# Patient Record
Sex: Female | Born: 1985 | Race: White | Hispanic: No | Marital: Married | Smoking: Never smoker
Health system: Southern US, Community
[De-identification: ages and names within clinical notes are randomized; demographics above are authoritative.]

## PROBLEM LIST (undated history)

## (undated) ENCOUNTER — Inpatient Hospital Stay (HOSPITAL_COMMUNITY): Payer: Self-pay

## (undated) DIAGNOSIS — I10 Essential (primary) hypertension: Secondary | ICD-10-CM

## (undated) DIAGNOSIS — K219 Gastro-esophageal reflux disease without esophagitis: Secondary | ICD-10-CM

## (undated) HISTORY — PX: BUNIONECTOMY: SHX129

---

## 2014-03-28 ENCOUNTER — Other Ambulatory Visit (HOSPITAL_COMMUNITY): Payer: Self-pay | Admitting: *Deleted

## 2014-03-28 ENCOUNTER — Encounter (HOSPITAL_COMMUNITY): Payer: Self-pay | Admitting: *Deleted

## 2014-03-28 DIAGNOSIS — Z3682 Encounter for antenatal screening for nuchal translucency: Secondary | ICD-10-CM

## 2014-03-29 ENCOUNTER — Encounter (HOSPITAL_COMMUNITY): Payer: Self-pay | Admitting: *Deleted

## 2014-04-06 ENCOUNTER — Other Ambulatory Visit (HOSPITAL_COMMUNITY): Payer: Self-pay

## 2014-04-12 ENCOUNTER — Ambulatory Visit (HOSPITAL_COMMUNITY)
Admission: RE | Admit: 2014-04-12 | Discharge: 2014-04-12 | Disposition: A | Discharge status: Home / Self Care | Payer: BLUE CROSS/BLUE SHIELD | Source: Ambulatory Visit | Attending: *Deleted | Admitting: *Deleted

## 2014-04-12 ENCOUNTER — Encounter (HOSPITAL_COMMUNITY): Payer: Self-pay

## 2014-04-12 ENCOUNTER — Other Ambulatory Visit (HOSPITAL_COMMUNITY): Payer: Self-pay | Admitting: *Deleted

## 2014-04-12 ENCOUNTER — Other Ambulatory Visit (HOSPITAL_COMMUNITY): Payer: Self-pay | Admitting: Obstetrics and Gynecology

## 2014-04-12 ENCOUNTER — Other Ambulatory Visit (HOSPITAL_COMMUNITY): Payer: Self-pay

## 2014-04-12 DIAGNOSIS — IMO0002 Reserved for concepts with insufficient information to code with codable children: Secondary | ICD-10-CM

## 2014-04-12 DIAGNOSIS — Z3A13 13 weeks gestation of pregnancy: Secondary | ICD-10-CM | POA: Insufficient documentation

## 2014-04-12 DIAGNOSIS — Z0489 Encounter for examination and observation for other specified reasons: Secondary | ICD-10-CM

## 2014-04-12 DIAGNOSIS — Z3682 Encounter for antenatal screening for nuchal translucency: Secondary | ICD-10-CM | POA: Insufficient documentation

## 2014-04-12 DIAGNOSIS — Z36 Encounter for antenatal screening of mother: Secondary | ICD-10-CM | POA: Insufficient documentation

## 2014-04-12 DIAGNOSIS — O10011 Pre-existing essential hypertension complicating pregnancy, first trimester: Secondary | ICD-10-CM | POA: Diagnosis not present

## 2014-04-12 DIAGNOSIS — O10919 Unspecified pre-existing hypertension complicating pregnancy, unspecified trimester: Secondary | ICD-10-CM | POA: Insufficient documentation

## 2014-04-12 HISTORY — DX: Essential (primary) hypertension: I10

## 2014-04-18 ENCOUNTER — Other Ambulatory Visit (HOSPITAL_COMMUNITY): Payer: Self-pay

## 2014-05-24 ENCOUNTER — Encounter (HOSPITAL_COMMUNITY): Payer: Self-pay

## 2014-05-24 ENCOUNTER — Ambulatory Visit (HOSPITAL_COMMUNITY)
Admission: RE | Admit: 2014-05-24 | Discharge: 2014-05-24 | Disposition: A | Discharge status: Home / Self Care | Payer: BLUE CROSS/BLUE SHIELD | Source: Ambulatory Visit | Attending: *Deleted | Admitting: *Deleted

## 2014-05-24 VITALS — BP 141/87 | HR 75 | Wt 280.8 lb

## 2014-05-24 DIAGNOSIS — O99212 Obesity complicating pregnancy, second trimester: Secondary | ICD-10-CM | POA: Insufficient documentation

## 2014-05-24 DIAGNOSIS — O99210 Obesity complicating pregnancy, unspecified trimester: Secondary | ICD-10-CM | POA: Insufficient documentation

## 2014-05-24 DIAGNOSIS — Z0489 Encounter for examination and observation for other specified reasons: Secondary | ICD-10-CM

## 2014-05-24 DIAGNOSIS — IMO0002 Reserved for concepts with insufficient information to code with codable children: Secondary | ICD-10-CM

## 2014-05-24 DIAGNOSIS — Z36 Encounter for antenatal screening of mother: Secondary | ICD-10-CM | POA: Diagnosis not present

## 2014-05-24 DIAGNOSIS — O10012 Pre-existing essential hypertension complicating pregnancy, second trimester: Secondary | ICD-10-CM | POA: Diagnosis present

## 2014-05-24 DIAGNOSIS — Z3689 Encounter for other specified antenatal screening: Secondary | ICD-10-CM | POA: Insufficient documentation

## 2014-05-24 DIAGNOSIS — Z3A19 19 weeks gestation of pregnancy: Secondary | ICD-10-CM | POA: Diagnosis not present

## 2014-05-24 DIAGNOSIS — O10912 Unspecified pre-existing hypertension complicating pregnancy, second trimester: Secondary | ICD-10-CM

## 2014-05-24 HISTORY — DX: Gastro-esophageal reflux disease without esophagitis: K21.9

## 2014-07-05 ENCOUNTER — Inpatient Hospital Stay (HOSPITAL_COMMUNITY)
Admission: AD | Admit: 2014-07-05 | Discharge: 2014-07-05 | Disposition: A | Discharge status: Home / Self Care | Payer: BLUE CROSS/BLUE SHIELD | Source: Ambulatory Visit | Attending: Obstetrics & Gynecology | Admitting: Obstetrics & Gynecology

## 2014-07-05 ENCOUNTER — Ambulatory Visit (HOSPITAL_COMMUNITY)
Admission: RE | Admit: 2014-07-05 | Discharge: 2014-07-05 | Disposition: A | Discharge status: Home / Self Care | Payer: BLUE CROSS/BLUE SHIELD | Source: Ambulatory Visit | Attending: *Deleted | Admitting: *Deleted

## 2014-07-05 ENCOUNTER — Encounter (HOSPITAL_COMMUNITY): Payer: Self-pay

## 2014-07-05 DIAGNOSIS — O4102X0 Oligohydramnios, second trimester, not applicable or unspecified: Secondary | ICD-10-CM | POA: Diagnosis not present

## 2014-07-05 DIAGNOSIS — O9989 Other specified diseases and conditions complicating pregnancy, childbirth and the puerperium: Secondary | ICD-10-CM | POA: Diagnosis present

## 2014-07-05 DIAGNOSIS — K219 Gastro-esophageal reflux disease without esophagitis: Secondary | ICD-10-CM | POA: Diagnosis not present

## 2014-07-05 DIAGNOSIS — O99612 Diseases of the digestive system complicating pregnancy, second trimester: Secondary | ICD-10-CM | POA: Diagnosis not present

## 2014-07-05 DIAGNOSIS — Z3A25 25 weeks gestation of pregnancy: Secondary | ICD-10-CM | POA: Diagnosis not present

## 2014-07-05 DIAGNOSIS — O4103X0 Oligohydramnios, third trimester, not applicable or unspecified: Secondary | ICD-10-CM | POA: Diagnosis not present

## 2014-07-05 DIAGNOSIS — O10012 Pre-existing essential hypertension complicating pregnancy, second trimester: Secondary | ICD-10-CM | POA: Diagnosis not present

## 2014-07-05 DIAGNOSIS — O99212 Obesity complicating pregnancy, second trimester: Secondary | ICD-10-CM

## 2014-07-05 DIAGNOSIS — O10912 Unspecified pre-existing hypertension complicating pregnancy, second trimester: Secondary | ICD-10-CM

## 2014-07-05 LAB — WET PREP, GENITAL
Clue Cells Wet Prep HPF POC: NONE SEEN
Trich, Wet Prep: NONE SEEN
Yeast Wet Prep HPF POC: NONE SEEN

## 2014-07-05 LAB — AMNISURE RUPTURE OF MEMBRANE (ROM) NOT AT ARMC: Amnisure ROM: NEGATIVE

## 2014-07-05 NOTE — Discharge Instructions (Signed)
Oligohydramnios °An unborn baby (fetus) lives in the mother's uterus in a sac of amniotic fluid. This fluid: °· Protects the fetus from trauma. °· Helps the fetus move freely inside the uterus. °· Helps the fetal lungs, kidneys, and digestive system develop. °· Protects the baby from infections.   °Oligohydramnios is when there is not enough amniotic fluid in the amniotic sac. If this happens early in pregnancy, a fetus might not develop normally. If this happens in the second half of a pregnancy, a fetus might not grow as much as it should and could cause problems during delivery.   °Oligohydramnios can also cause: °· Pregnancy loss (miscarriage). °· Premature birth. °· Deformities of the face or body. °· Problems with muscles and bones. °· Pressure and compression on the umbilical cord, which decreases oxygen to the fetus. °· Lung problems. °· Stillbirth. °CAUSES °A cause cannot always be found. However, possible causes include: °· A leak or a tear in the amniotic sac. °· A problem with the placenta. °· Having identical twins who share the same placenta. °· A fetal birth defect. This is usually something in the fetal kidneys or urinary tract that has not developed as it should. °· A pregnancy that goes past the due date. °· Something that affects the mother's body, such as: °¨ Dehydration. °¨ High blood pressure. °¨ Diabetes. °¨ Some medications (examples include ibuprofen and blood pressure medicines).  °¨ A disease that affects the skin, joints, kidneys, and other organs (systemic lupus). °¨ Birth defects. °SYMPTOMS °· There may be no symptoms.  °· Leaking fluid from the vagina. °· Measuring smaller than usual uterus at a routine pregnancy exam. °DIAGNOSIS °To diagnose and evaluate oligohydramnios, your caregiver may: °· Order a prenatal ultrasound test. This test: °¨ Measures the amniotic fluid level. This will show if the amount of fluid is right for the stage of pregnancy. °¨ Checks the fetal  kidneys. °¨ Checks fetal growth. °¨ Evaluates the placenta. °· Confirm that you broke your water, if this is suspected by your caregiver. °· Order a nonstress test. This noninvasive test is an assessment of fetal well-being. It monitors the fetal heart rate patterns over a 20-minute period. °· Order a biophysical profile. This test measures and evaluates 5 observations of the baby (results of nonstress testing, fetal breathing, movements, muscle tone, and amount of amniotic fluid). °· Assess fetal kick counts. Tell your caregiver if the baby appears to become less active. °· Order a uterine artery Doppler study. This is a type of ultrasound. It can show if enough blood and nourishment are getting to the fetus through the placenta and umbilical cord. °· Check your blood pressure. °· Check your blood sugar. °TREATMENT °Treatment will depend on how low the fluid is, how far along in the pregnancy you are, and your overall health. Treatment options include: °· Watching and waiting. You will need to be checked more often than normal. °· Increasing your fluid intake. This may be done by mouth, or you might get the fluids through the vein (intravenously, IV). °· Delivering the baby if recommended by your caregiver. °HOME CARE INSTRUCTIONS °· Only take medicine as directed by your caregiver, especially if you have a medical problem (diabetes, high blood pressure). °· Follow your caregiver's instructions regarding physical activity, especially if you have a medical problem (diabetes, high blood pressure). °· Keep all prenatal care appointments. °· Rest as much as possible. Your caregiver may put you on bed rest. °· Drink enough fluids to keep your urine clear or pale yellow. °·   Eat a healthy and nourishing diet. °· Do not smoke, drink alcohol, or take illegal drugs.  °SEEK MEDICAL CARE IF: °· You have any questions or worries about your pregnancy. °· You notice a decrease in fetal movement. °SEEK IMMEDIATE MEDICAL CARE IF:   °· Fluid comes out of your vagina. °· You start to have labor pains (contractions). °· You have a fever. °Document Released: 06/18/2010 Document Revised: 07/18/2013 Document Reviewed: 06/18/2010 °[employer]® Patient Information ©2015 [employer]. This information is not intended to replace advice given to you by your health care provider. Make sure you discuss any questions you have with your health care provider. ° °

## 2014-07-05 NOTE — MAU Note (Addendum)
Sent from MFM for further  Eval..  Hx of CHTN, not currently on meds.  Has been good, stressed-anxious/scared now.  AFI was lower today, no report of leaking.  Sent to r/u ROM

## 2014-07-05 NOTE — MAU Provider Note (Signed)
  History     CSN: [id number]  Arrival date and time: 07/05/14 1107   None     No chief complaint on file.  HPI  29 y.o. G1P0000 @[redacted]w[redacted]d  presents to MAU from MFM office for sterile spec exam and and amnisure. Denies contractions.  Past Medical History  Diagnosis Date  . Hypertension   . GERD (gastroesophageal reflux disease)     Past Surgical History  Procedure Laterality Date  . Bunionectomy      No family history on file.  History  Substance Use Topics  . Smoking status: Never Smoker   . Smokeless tobacco: Not on file  . Alcohol Use: No    Allergies: No Known Allergies  Prescriptions prior to admission  Medication Sig Dispense Refill Last Dose  . Cranberry 400 MG TABS Take 1 tablet by mouth daily.   Past Week at Unknown time  . docusate sodium (COLACE) 100 MG capsule Take 100 mg by mouth daily.   07/04/2014 at Unknown time  . Prenatal Vit-Fe Fumarate-FA (PRENATAL MULTIVITAMIN) TABS tablet Take 1 tablet by mouth daily at 12 noon.   07/05/2014 at Unknown time  . ranitidine (ZANTAC) 150 MG tablet Take 150 mg by mouth every evening.   07/04/2014 at Unknown time    Review of Systems  Genitourinary:       White vaginal discharge  All other systems reviewed and are negative.  Physical Exam   Blood pressure 144/98, pulse 76, temperature 97.8 F (36.6 C), temperature source Oral, resp. rate 18, last menstrual period 09/29/2013.  Physical Exam  Nursing note and vitals reviewed. Constitutional: She is oriented to person, place, and time. She appears well-developed and well-nourished. No distress.  HENT:  Head: Normocephalic and atraumatic.  Neck: Normal range of motion.  Cardiovascular: Normal rate.   Respiratory: Effort normal.  GI: Soft. She exhibits no distension and no mass. There is no tenderness. There is no rebound and no guarding.  Genitourinary: Vaginal discharge found.  No pooling noted on spec exam  Musculoskeletal: Normal range of motion.   Neurological: She is alert and oriented to person, place, and time.  Skin: Skin is warm and dry.  Psychiatric: She has a normal mood and affect. Her behavior is normal. Judgment and thought content normal.   Results for orders placed or performed during the hospital encounter of 07/05/14 (from the past 24 hour(s))  Amnisure rupture of membrane (rom)     Status: None   Collection Time: 07/05/14 11:50 AM  Result Value Ref Range   Amnisure ROM NEGATIVE   Wet prep, genital     Status: Abnormal   Collection Time: 07/05/14 11:50 AM  Result Value Ref Range   Yeast Wet Prep HPF POC NONE SEEN NONE SEEN   Trich, Wet Prep NONE SEEN NONE SEEN   Clue Cells Wet Prep HPF POC NONE SEEN NONE SEEN   WBC, Wet Prep HPF POC MODERATE (A) NONE SEEN    MAU Course  Procedures  MDM amnisure  Assessment and Plan  Oligohydramnios  Return to MFM at regular scheduled appointment Discharge to home  [facility],Jahseh Lucchese Grissett 07/05/2014, 12:04 PM

## 2014-07-14 ENCOUNTER — Other Ambulatory Visit (HOSPITAL_COMMUNITY): Payer: Self-pay | Admitting: Maternal and Fetal Medicine

## 2014-07-14 DIAGNOSIS — O10919 Unspecified pre-existing hypertension complicating pregnancy, unspecified trimester: Secondary | ICD-10-CM

## 2014-07-14 DIAGNOSIS — O99210 Obesity complicating pregnancy, unspecified trimester: Secondary | ICD-10-CM

## 2014-08-03 ENCOUNTER — Encounter (HOSPITAL_COMMUNITY): Payer: Self-pay

## 2014-08-03 ENCOUNTER — Ambulatory Visit (HOSPITAL_COMMUNITY)
Admission: RE | Admit: 2014-08-03 | Discharge: 2014-08-03 | Disposition: A | Discharge status: Home / Self Care | Payer: BLUE CROSS/BLUE SHIELD | Source: Ambulatory Visit | Attending: *Deleted | Admitting: *Deleted

## 2014-08-03 ENCOUNTER — Other Ambulatory Visit (HOSPITAL_COMMUNITY): Payer: Self-pay | Admitting: Maternal and Fetal Medicine

## 2014-08-03 DIAGNOSIS — O99210 Obesity complicating pregnancy, unspecified trimester: Secondary | ICD-10-CM

## 2014-08-03 DIAGNOSIS — O10919 Unspecified pre-existing hypertension complicating pregnancy, unspecified trimester: Secondary | ICD-10-CM

## 2014-08-03 DIAGNOSIS — O99213 Obesity complicating pregnancy, third trimester: Secondary | ICD-10-CM | POA: Diagnosis not present

## 2014-08-03 DIAGNOSIS — Z3A29 29 weeks gestation of pregnancy: Secondary | ICD-10-CM | POA: Diagnosis not present

## 2014-08-03 DIAGNOSIS — O10013 Pre-existing essential hypertension complicating pregnancy, third trimester: Secondary | ICD-10-CM | POA: Diagnosis present

## 2014-08-03 DIAGNOSIS — E669 Obesity, unspecified: Secondary | ICD-10-CM | POA: Insufficient documentation

## 2014-09-01 ENCOUNTER — Ambulatory Visit (HOSPITAL_COMMUNITY)
Admission: RE | Admit: 2014-09-01 | Discharge: 2014-09-01 | Disposition: A | Discharge status: Home / Self Care | Payer: BLUE CROSS/BLUE SHIELD | Source: Ambulatory Visit | Attending: *Deleted | Admitting: *Deleted

## 2014-09-01 ENCOUNTER — Encounter (HOSPITAL_COMMUNITY): Payer: Self-pay

## 2014-09-01 DIAGNOSIS — Z36 Encounter for antenatal screening of mother: Secondary | ICD-10-CM | POA: Insufficient documentation

## 2014-09-01 DIAGNOSIS — Z0489 Encounter for examination and observation for other specified reasons: Secondary | ICD-10-CM | POA: Insufficient documentation

## 2014-09-01 DIAGNOSIS — O99210 Obesity complicating pregnancy, unspecified trimester: Secondary | ICD-10-CM | POA: Insufficient documentation

## 2014-09-01 DIAGNOSIS — O10913 Unspecified pre-existing hypertension complicating pregnancy, third trimester: Secondary | ICD-10-CM | POA: Diagnosis present

## 2014-09-01 DIAGNOSIS — Z3A33 33 weeks gestation of pregnancy: Secondary | ICD-10-CM | POA: Insufficient documentation

## 2014-09-01 DIAGNOSIS — IMO0002 Reserved for concepts with insufficient information to code with codable children: Secondary | ICD-10-CM | POA: Insufficient documentation

## 2014-09-01 DIAGNOSIS — O10919 Unspecified pre-existing hypertension complicating pregnancy, unspecified trimester: Secondary | ICD-10-CM | POA: Insufficient documentation

## 2014-09-19 LAB — US OB FOLLOW UP

## 2014-09-26 ENCOUNTER — Other Ambulatory Visit (HOSPITAL_COMMUNITY): Payer: Self-pay | Admitting: *Deleted

## 2014-09-26 DIAGNOSIS — O4103X1 Oligohydramnios, third trimester, fetus 1: Secondary | ICD-10-CM

## 2014-09-29 ENCOUNTER — Ambulatory Visit (HOSPITAL_COMMUNITY): Admission: RE | Admit: 2014-09-29 | Payer: BLUE CROSS/BLUE SHIELD | Source: Ambulatory Visit

## 2014-09-29 ENCOUNTER — Encounter (HOSPITAL_COMMUNITY): Payer: BLUE CROSS/BLUE SHIELD

## 2014-10-02 ENCOUNTER — Ambulatory Visit (HOSPITAL_COMMUNITY): Payer: BLUE CROSS/BLUE SHIELD

## 2014-10-20 ENCOUNTER — Other Ambulatory Visit (HOSPITAL_COMMUNITY): Payer: Self-pay | Admitting: *Deleted

## 2014-10-20 ENCOUNTER — Encounter (HOSPITAL_COMMUNITY): Payer: Self-pay | Admitting: *Deleted

## 2015-02-15 ENCOUNTER — Encounter (HOSPITAL_COMMUNITY): Payer: Self-pay | Admitting: *Deleted

## 2016-12-29 IMAGING — US US OB DETAIL+14 WK
1 series · 12 of 28 positions shown · non-contrast
Comparison: none

[Series 1: us ob detail+14 wk · 0.28mm/px · 12 of 64 slices shown]
[im 3/64]
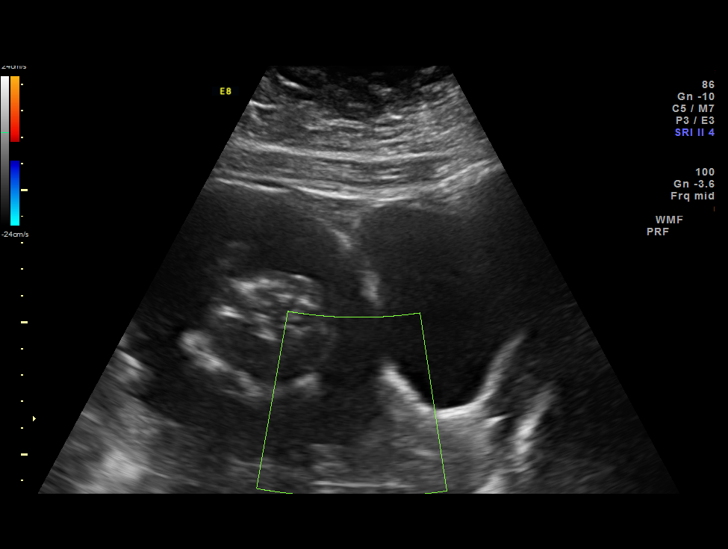
[im 8/64]
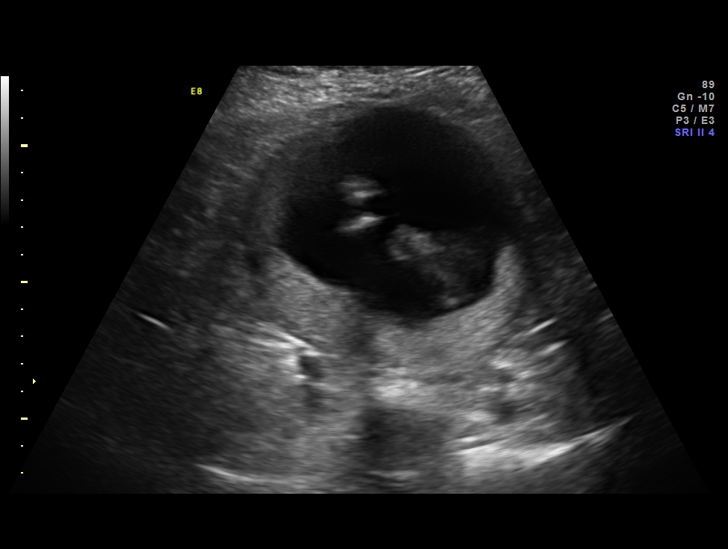
[im 12/64]
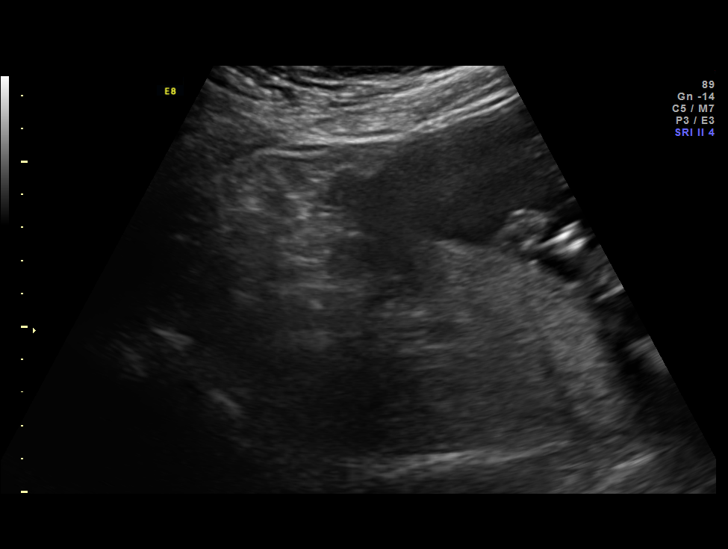
[im 19/64]
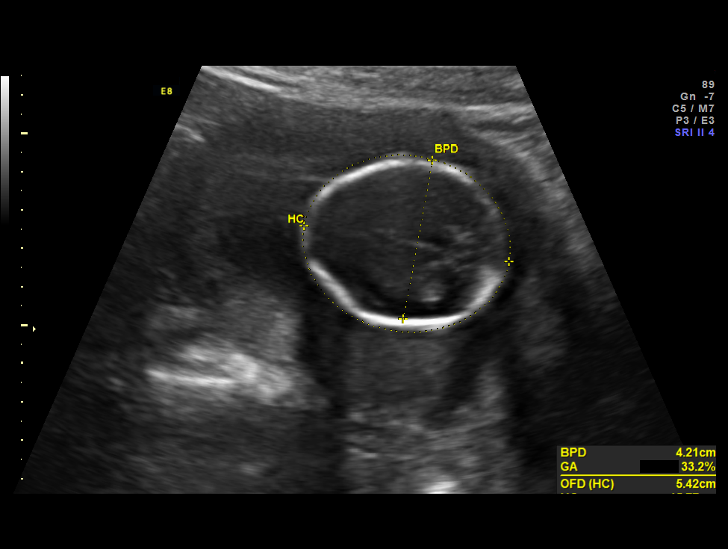
[im 24/64]
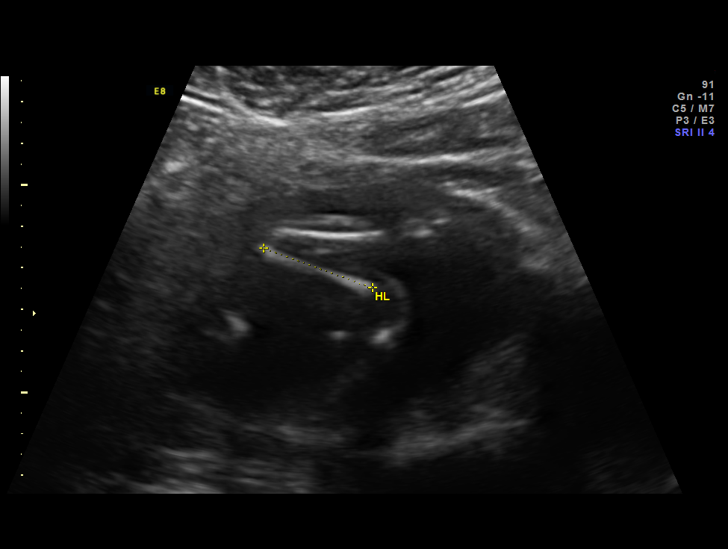
[im 29/64]
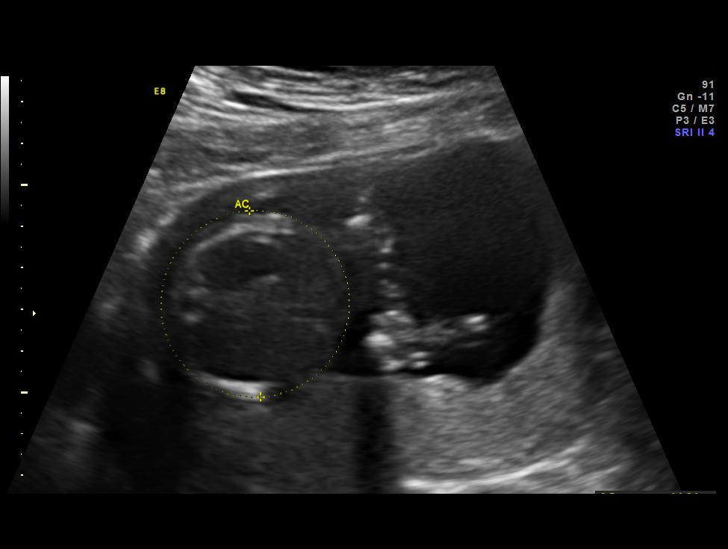
[im 36/64]
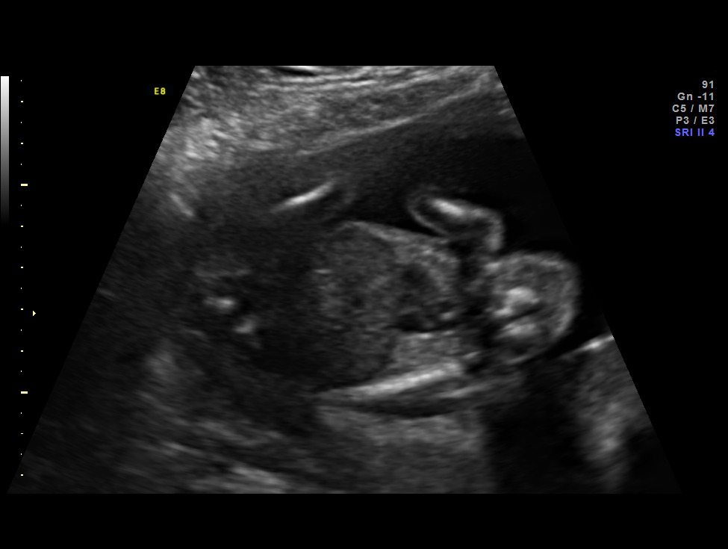
[im 40/64]
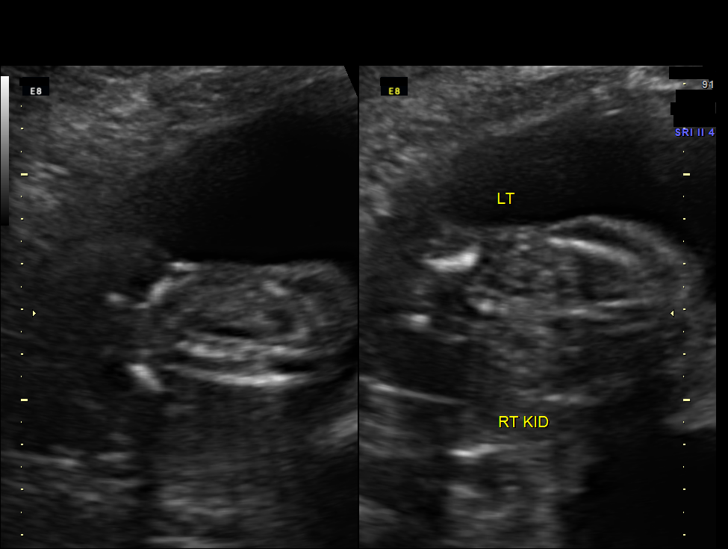
[im 45/64]
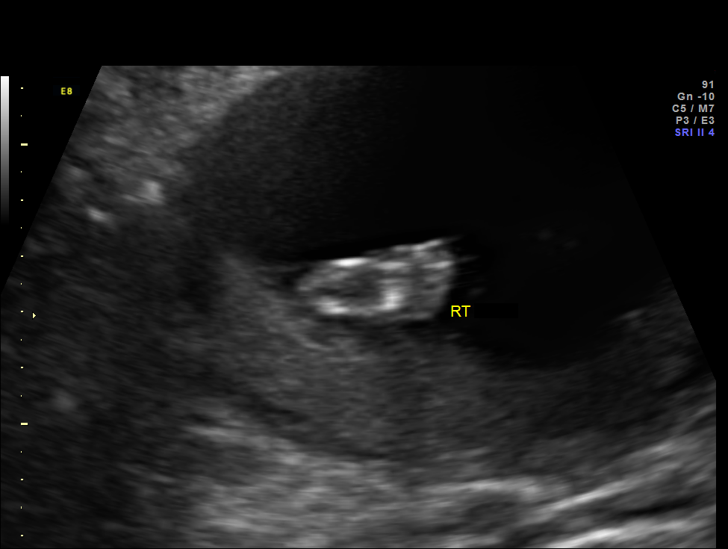
[im 52/64]
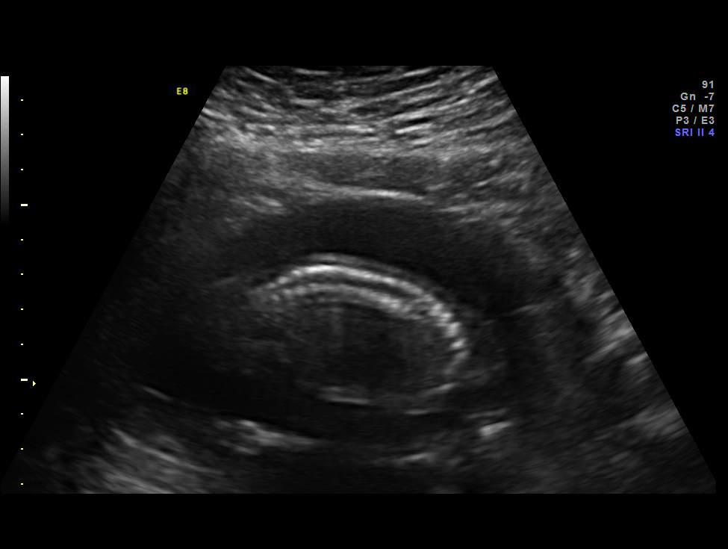
[im 57/64]
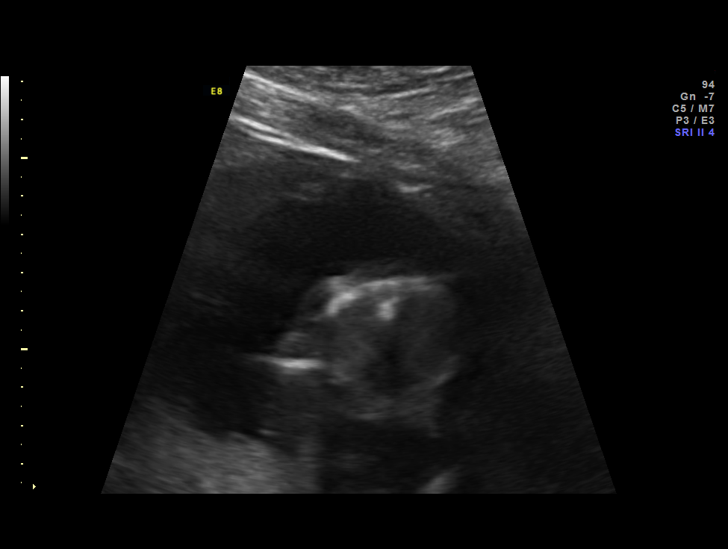
[im 61/64]
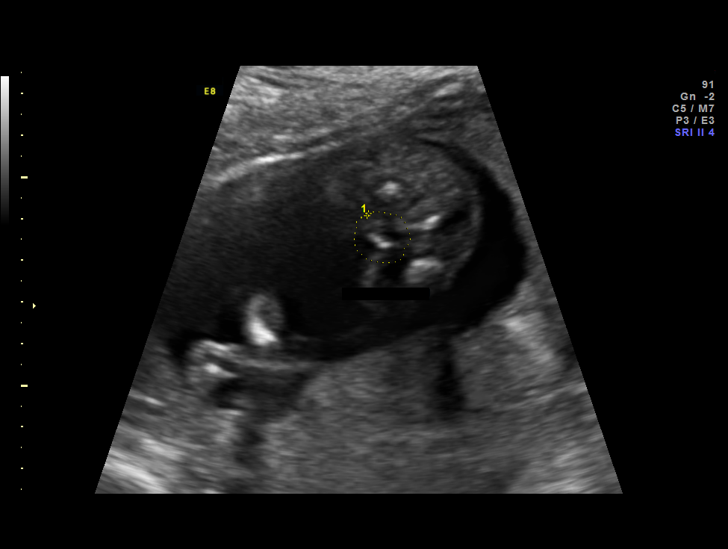

[12 of 28 positions shown; findings below may reference images not displayed]

OBSTETRICS REPORT
                      (Signed Final 05/24/2014 [DATE])

Service(s) Provided

 US OB DETAIL + 14 WK                                  76811.0
Indications

 Detailed fetal anatomic survey                        Z36
 Hypertension - Chronic/Pre-existing (No current
 medication)
 Obesity complicating pregnancy
 19 weeks gestation of pregnancy
Fetal Evaluation

 Num Of Fetuses:    1
 Fetal Heart Rate:  150                          bpm
 Cardiac Activity:  Observed
 Presentation:      Cephalic
 Placenta:          Posterior, above cervical
                    os
 P. Cord            Visualized, central
 Insertion:

 Amniotic Fluid
 AFI FV:      Subjectively within normal limits
                                             Larg Pckt:     6.8  cm
Biometry

 BPD:     42.4  mm     G. Age:  18w 6d                CI:         79.0   70 - 86
 OFD:     53.7  mm                                    FL/HC:      17.4   16.1 -

 HC:     155.9  mm     G. Age:  18w 3d       17  %    HC/AC:      1.09   1.09 -

 AC:     143.5  mm     G. Age:  19w 5d       64  %    FL/BPD:
 FL:      27.1  mm     G. Age:  18w 2d       17  %    FL/AC:      18.9   20 - 24
 HUM:     26.2  mm     G. Age:  18w 1d       27  %

 Est. FW:     267  gm      0 lb 9 oz     42  %
Gestational Age

 LMP:           33w 6d        Date:  09/29/13                 EDD:   07/06/14
 U/S Today:     18w 6d                                        EDD:   10/19/14
 Best:          19w 1d     Det. By:  [facility]         EDD:   10/17/14
                                     (03/08/14)
Anatomy

 Cranium:          Appears normal         Aortic Arch:      Not well visualized
 Fetal Cavum:      Not well visualized    Ductal Arch:      Not well visualized
 Ventricles:       Appears normal         Diaphragm:        Appears normal
 Choroid Plexus:   Appears normal         Stomach:          Appears normal, left
                                                            sided
 Cerebellum:       Appears normal         Abdomen:          Appears normal
 Posterior Fossa:  Appears normal         Abdominal Wall:   Appears nml (cord
                                                            insert, abd wall)
 Nuchal Fold:      Appears normal         Cord Vessels:     Appears normal (3
                                                            vessel cord)
 Face:             Appears normal         Kidneys:          Appear normal
                   (orbits and profile)
 Lips:             Not well visualized    Bladder:          Appears normal
 Heart:            Not well visualized    Spine:            Limited views
                                                            appear normal
 RVOT:             Not well visualized    Lower             Present
                                          Extremities:
 LVOT:             Not well visualized    Upper             Present
                                          Extremities:

 Other:  Fetus appears to be a female.Technically difficult due to maternal
         habitus and fetal position.
Targeted Anatomy

 Fetal Central Nervous System
 Lat. Ventricles:
Cervix Uterus Adnexa

 Cervical Length:    3.6      cm

 Cervix:       Normal appearance by transabdominal scan.

 Right Ovary:   Not visualized.
 Adnexa:     No adnexal mass visualized.
Impression

 Single IUP at 18w 6d
 CHTN. Obesity
 Limited views of the fetal heart and spine obtained
 The remainder of the fetal anatomy appears normal
 Posterior placenta without previa
 Normal amniotic fluid volume
Recommendations

 Recommend follow-up ultrasound examination in 6 weeks to
 complete anatomy

## 2017-02-09 IMAGING — US US OB FOLLOW-UP
1 series · 12 of 26 positions shown · non-contrast
Comparison: none

[Series 1: us ob follow-up · 26 acquisitions, 12 frames shown]
[im 2/26]
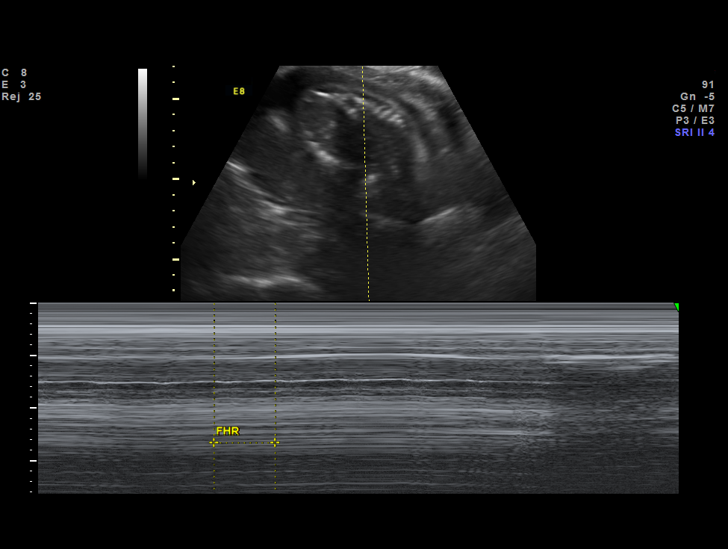
[im 4/26]
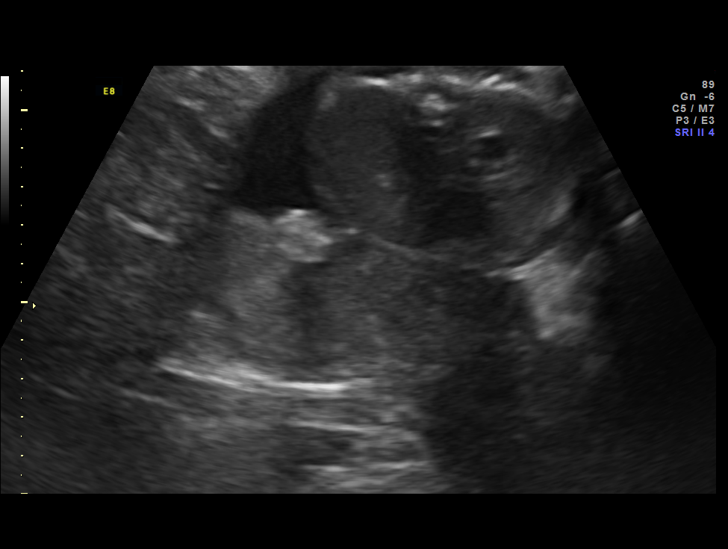
[im 6/26]
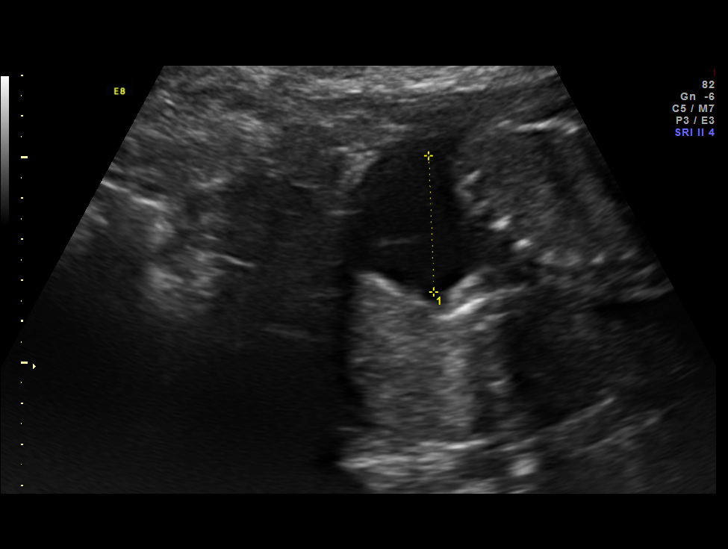
[im 8/26]
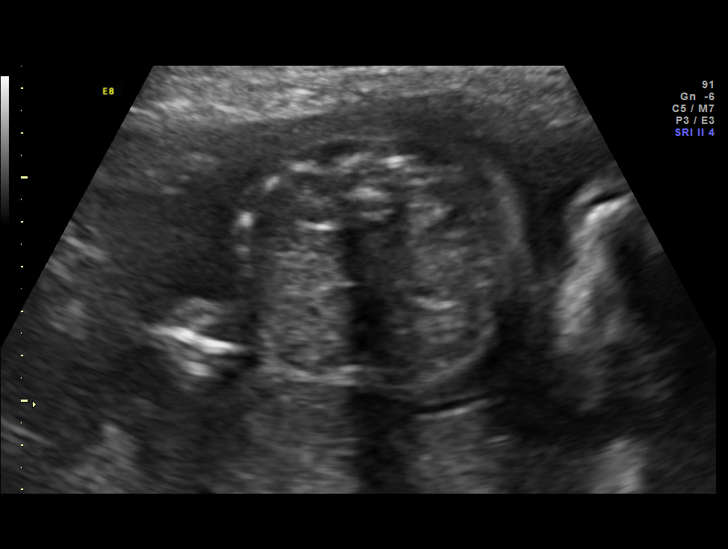
[im 10/26]
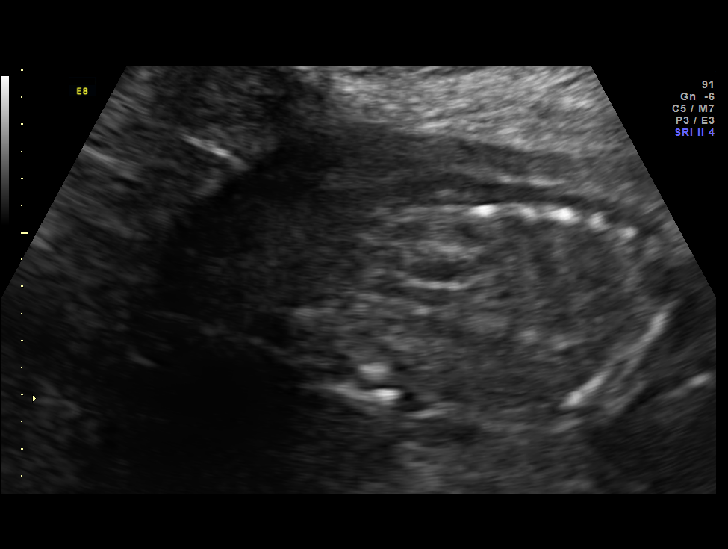
[im 12/26]
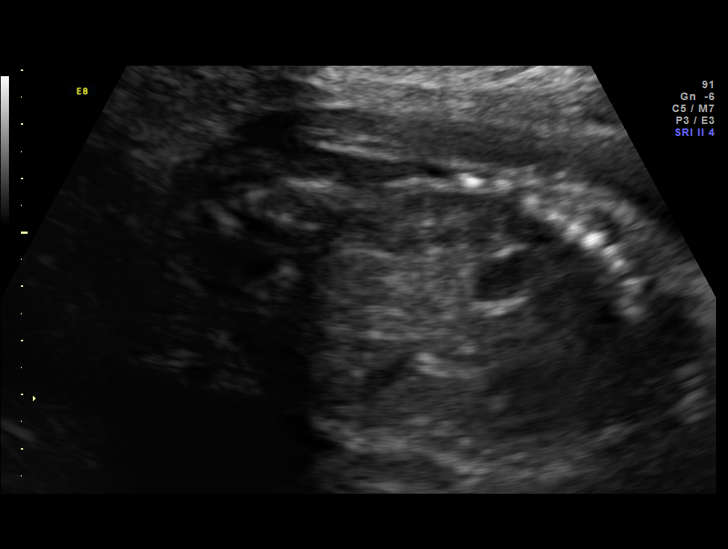
[im 15/26]
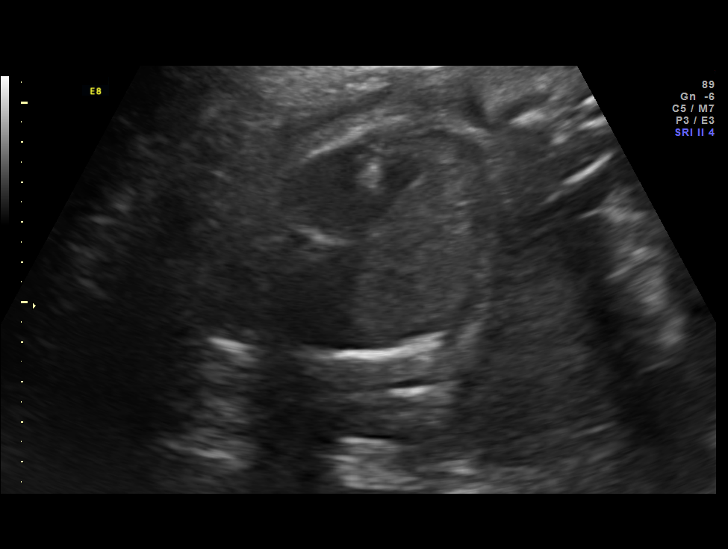
[im 17/26]
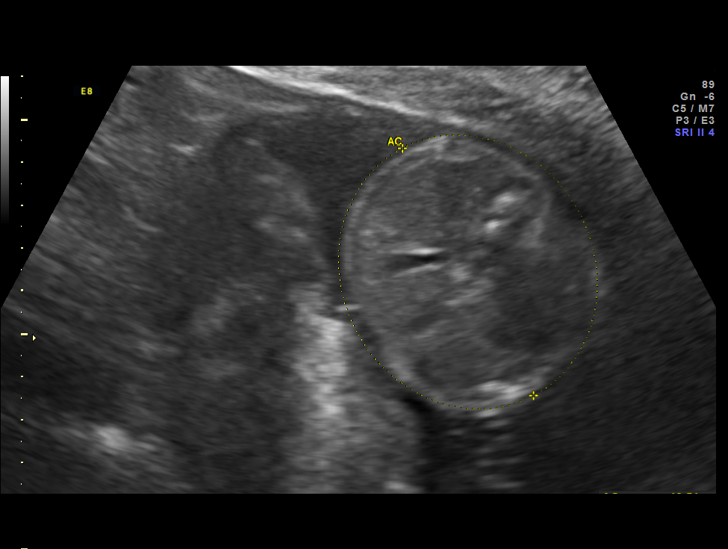
[im 19/26]
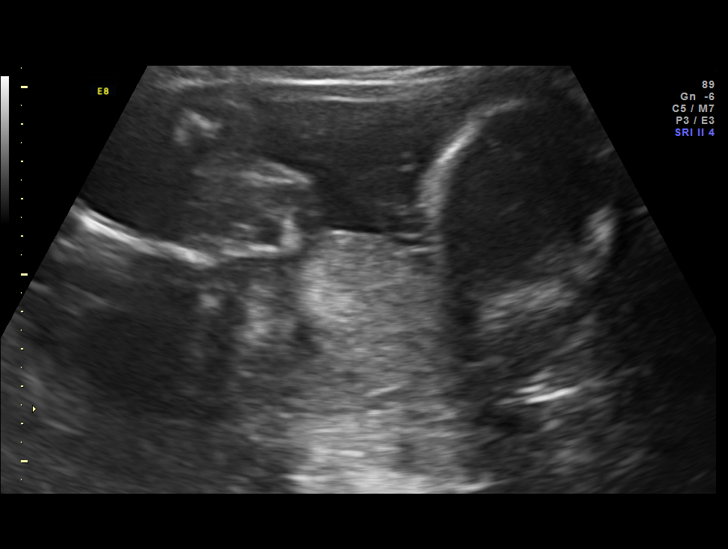
[im 21/26]
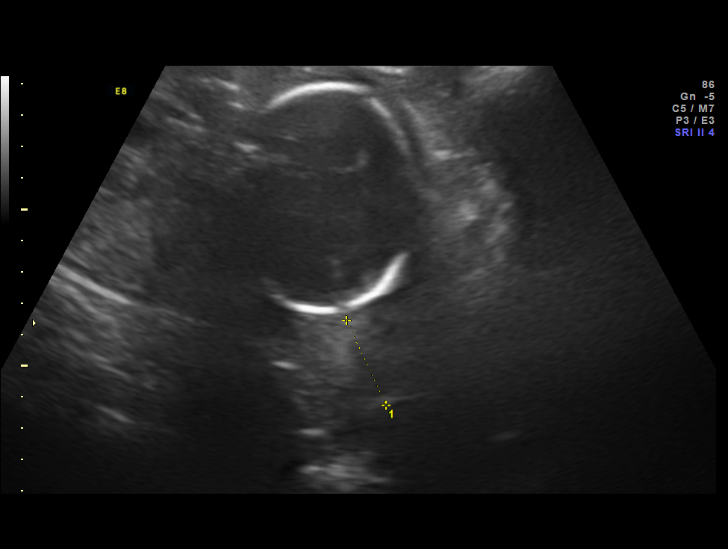
[im 23/26]
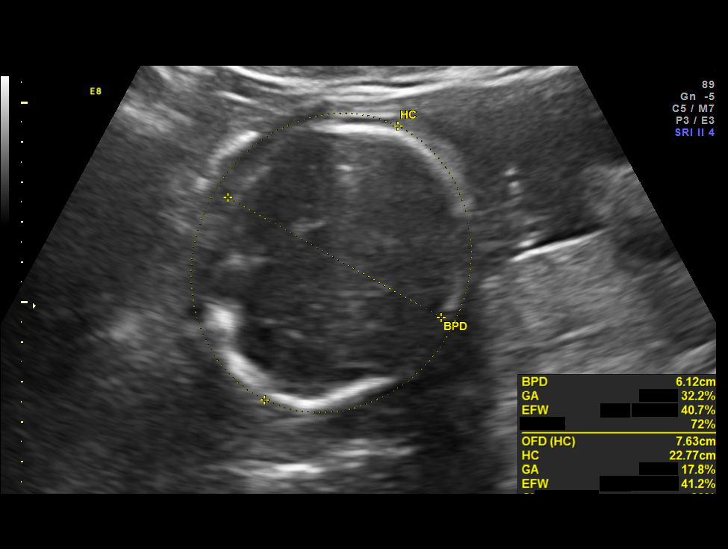
[im 25/26]
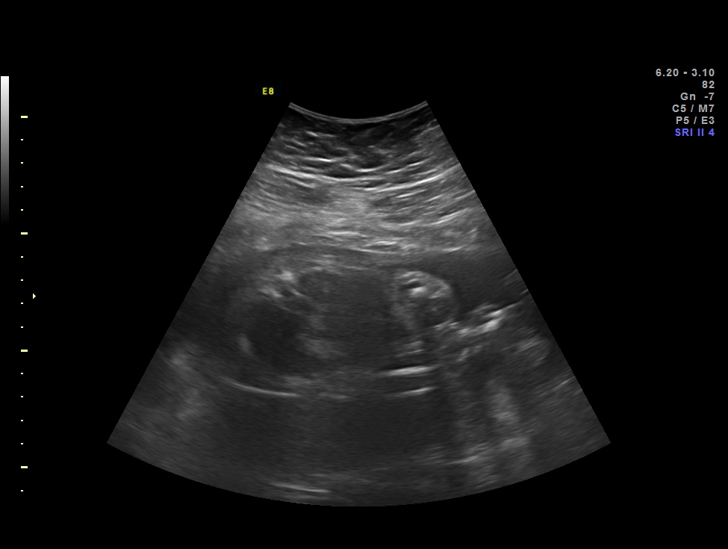

[12 of 26 positions shown; findings below may reference images not displayed]

OBSTETRICS REPORT
(Signed Final 07/05/2014 [DATE])

Service(s) Provided

US OB FOLLOW UP                                       76816.1
Indications

Hypertension - Chronic/Pre-existing (No current
medication)
Obesity complicating pregnancy
25 weeks gestation of pregnancy
Fetal Evaluation

Num Of Fetuses:    1
Fetal Heart Rate:  165                          bpm
Cardiac Activity:  Observed
Presentation:      Cephalic
Placenta:          Posterior, above cervical
os
P. Cord            Previously Visualized
Insertion:

Amniotic Fluid
AFI FV:      Subjectively decreased
Larg Pckt:     3.3  cm
Biometry

BPD:     63.2  mm     G. Age:  25w 4d                CI:         82.4   70 - 86
OFD:     76.7  mm                                    FL/HC:      19.5   18.7 -
20.3
HC:     227.8  mm     G. Age:  24w 5d       19  %    HC/AC:      1.15   1.04 -
1.22
AC:     197.6  mm     G. Age:  24w 3d       21  %    FL/BPD:     70.3   71 - 87
FL:      44.4  mm     G. Age:  24w 5d       22  %    FL/AC:      22.5   20 - 24
HUM:     42.3  mm     G. Age:  25w 3d       50  %

Est. FW:     713  gm      1 lb 9 oz     40  %
Gestational Age

LMP:           39w 6d        Date:  09/29/13                 EDD:   07/06/14
U/S Today:     24w 6d                                        EDD:   10/19/14
Best:          25w 1d     Det. By:  Early [facility]trasound         EDD:   10/17/14
(03/08/14)
Anatomy
Cranium:          Previously seen        Aortic Arch:      Not well visualized
Fetal Cavum:      Not well visualized    Ductal Arch:      Not well visualized
Ventricles:       Previously seen        Diaphragm:        Previously seen
Choroid Plexus:   Previously seen        Stomach:          Appears normal, left
sided
Cerebellum:       Previously seen        Abdomen:          Previously seen
Posterior Fossa:  Previously seen        Abdominal Wall:   Previously seen
Nuchal Fold:      Previously seen        Cord Vessels:     Previously seen
Face:             Orbits and profile     Kidneys:          Appear normal
previously seen
Lips:             Not well visualized    Bladder:          Appears normal
Heart:            Not well visualized    Spine:            Limited views
previously seen
RVOT:             Not well visualized    Lower             Previously seen
Extremities:
LVOT:             Not well visualized    Upper             Previously seen
Extremities:

Other:  Female gender.
Impression

Single IUP at 25w 1d
CHTN, Obesity
Limited views of the fetal heart, lips and spine obtained due
to fetal position and subjectively decreased amniotic fluid
volume
Interval growth is appropriate (40th %tile)
Subjectively decreased amniotic fluid volume (max vertical
pocket 3.3 cm)
Recommendations

Ms. Abega was sent to [facility] for sterile speculum exam and
amnisure to rule out ruptured membranes.
If no evidence of PROM, recommend follow up ultrasound in
4 weeks to reasses the fetal anatomy and amniotic fluid
volume assessment.
